# Patient Record
Sex: Male | Born: 1995 | Race: Black or African American | Hispanic: No | Marital: Single | Smoking: Never smoker
Health system: Southern US, Community
[De-identification: ages and names within clinical notes are randomized; demographics above are authoritative.]

## PROBLEM LIST (undated history)

## (undated) DIAGNOSIS — I10 Essential (primary) hypertension: Secondary | ICD-10-CM

## (undated) DIAGNOSIS — M1990 Unspecified osteoarthritis, unspecified site: Secondary | ICD-10-CM

---
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
## 2019-08-04 ENCOUNTER — Ambulatory Visit (HOSPITAL_COMMUNITY)
Admission: EM | Admit: 2019-08-04 | Discharge: 2019-08-04 | Disposition: A | Discharge status: Home / Self Care | Payer: Self-pay

## 2019-08-04 ENCOUNTER — Other Ambulatory Visit: Payer: Self-pay

## 2019-08-04 ENCOUNTER — Encounter (HOSPITAL_COMMUNITY): Payer: Self-pay | Admitting: Urgent Care

## 2019-08-04 DIAGNOSIS — K047 Periapical abscess without sinus: Secondary | ICD-10-CM

## 2019-08-04 DIAGNOSIS — K0889 Other specified disorders of teeth and supporting structures: Secondary | ICD-10-CM

## 2019-08-04 HISTORY — DX: Essential (primary) hypertension: I10

## 2019-08-04 HISTORY — DX: Unspecified osteoarthritis, unspecified site: M19.90

## 2019-08-04 MED ORDER — HYDROXYZINE HCL 25 MG PO TABS: 12.5000 mg | ORAL_TABLET | Freq: Every evening | ORAL | 0 refills | Status: AC | PRN

## 2019-08-04 MED ORDER — AMOXICILLIN-POT CLAVULANATE 875-125 MG PO TABS: 1.0000 | ORAL_TABLET | Freq: Two times a day (BID) | ORAL | 0 refills | Status: AC

## 2019-08-04 MED ORDER — NAPROXEN 500 MG PO TABS: 500.0000 mg | ORAL_TABLET | Freq: Two times a day (BID) | ORAL | 0 refills | Status: AC

## 2019-08-04 NOTE — Discharge Instructions (Addendum)
[facility] [phone] extension [phone]  Dr. Civils [phone] [address]  [facility] [phone] [address]  [facility] [phone] extension [phone] [place], [place], [address] First come first serve for the first 10 clients.  May do simple extractions only, no wisdom teeth or surgery.  You may try the second for Thursday of the month starting at 6:30 AM.  [employer] You may call the school to see if they are still helping to provide dental care for emergent cases.  

## 2019-08-04 NOTE — ED Provider Notes (Signed)
  [facility]   MRN: [id number] DOB: May 02, 1996  Subjective:   Christopher Duke is a 24 y.o. male presenting for 5 day hx of acute onset recurrent right lower tooth pain. Patient has had dental work over said area and believes it was done poorly. He attempted to pull his own tooth this week, states he loosened it. His sx are worse now including facial pain and swelling.   No current facility-administered medications for this encounter. No current outpatient medications on file.   No Known Allergies  Past Medical History:  Diagnosis Date  . Arthritis   . Hypertension      History reviewed. No pertinent surgical history.  Family History  Problem Relation Age of Onset  . Rheumatologic disease Mother   . Cancer Father     Social History   Tobacco Use  . Smoking status: Not on file  Substance Use Topics  . Alcohol use: Not on file  . Drug use: Not on file    ROS   Objective:   Vitals: BP 138/89 (BP Location: Left Arm)   Pulse 98   Temp 98.6 F (37 C) (Oral)   Resp 18   SpO2 97%   Physical Exam Constitutional:      General: He is not in acute distress.    Appearance: Normal appearance. He is well-developed and normal weight. He is not ill-appearing, toxic-appearing or diaphoretic.  HENT:     Head: Normocephalic and atraumatic.      Right Ear: External ear normal.     Left Ear: External ear normal.     Nose: Nose normal.     Mouth/Throat:     Pharynx: Oropharynx is clear.   Eyes:     General: No scleral icterus.       Right eye: No discharge.        Left eye: No discharge.     Extraocular Movements: Extraocular movements intact.     Pupils: Pupils are equal, round, and reactive to light.  Cardiovascular:     Rate and Rhythm: Normal rate.  Pulmonary:     Effort: Pulmonary effort is normal.  Musculoskeletal:     Cervical back: Normal range of motion.  Skin:    General: Skin is warm and dry.  Neurological:     Mental Status: He is alert and  oriented to person, place, and time.  Psychiatric:        Mood and Affect: Mood normal.        Behavior: Behavior normal.        Thought Content: Thought content normal.        Judgment: Judgment normal.      Assessment and Plan :   1. Pain, dental   2. Dental infection     Counseled against any further attempts at pulling his tooth at home. Start Augmentin for dental infection, naproxen for pain. Emphasized need for dental surgeon consult. Counseled patient on potential for adverse effects with medications prescribed/recommended today, strict ER and return-to-clinic precautions discussed, patient verbalized understanding.  At the end of his OV, requested insomnia medication. Will have him use hydroxyzine and f/u with his PCP for further work up and management of his insomnia.    Wallis Bamberg, PA-C 08/04/19 1843

## 2019-08-04 NOTE — ED Triage Notes (Signed)
Dental pain started bothering patient Tuesday or Wednesday.  Pain is bottom , right.  Patient tried to pull tooth yesterday and part of tooth came out

## 2019-09-13 ENCOUNTER — Ambulatory Visit: Payer: Self-pay | Attending: Internal Medicine

## 2019-09-13 DIAGNOSIS — Z23 Encounter for immunization: Secondary | ICD-10-CM

## 2019-09-13 NOTE — Progress Notes (Signed)
   Covid-19 Vaccination Clinic  Name:  Christopher Duke    MRN: [id number] DOB: January 19, 1996  09/13/2019  [name] was observed post Covid-19 immunization for 15 minutes without incident. He was provided with Vaccine Information Sheet and instruction to access the V-Safe system.   [name] was instructed to call 911 with any severe reactions post vaccine: Marland Kitchen Difficulty breathing  . Swelling of face and throat  . A fast heartbeat  . A bad rash all over body  . Dizziness and weakness   Immunizations Administered    Name Date Dose VIS Date Route   Pfizer COVID-19 Vaccine 09/13/2019  5:08 PM 0.3 mL 05/31/2019 Intramuscular   Manufacturer: [employer], [employer]   Lot: [id number]   NDC: [address]-5

## 2019-10-08 ENCOUNTER — Ambulatory Visit: Payer: Self-pay | Attending: Internal Medicine

## 2019-10-08 DIAGNOSIS — Z23 Encounter for immunization: Secondary | ICD-10-CM

## 2019-10-08 NOTE — Progress Notes (Signed)
   Covid-19 Vaccination Clinic  Name:  Christopher Duke    MRN: [id number] DOB: June 14, 1996  10/08/2019  [name] was observed post Covid-19 immunization for 15 minutes without incident. He was provided with Vaccine Information Sheet and instruction to access the V-Safe system.   [name] was instructed to call 911 with any severe reactions post vaccine: Marland Kitchen Difficulty breathing  . Swelling of face and throat  . A fast heartbeat  . A bad rash all over body  . Dizziness and weakness   Immunizations Administered    Name Date Dose VIS Date Route   Pfizer COVID-19 Vaccine 10/08/2019  4:51 PM 0.3 mL 08/14/2018 Intramuscular   Manufacturer: [employer], [employer]   Lot: [id number]   NDC: [address]-6
